# Patient Record
Sex: Male | Born: 1980 | Race: White | Hispanic: No | Marital: Single | Smoking: Current every day smoker
Health system: Southern US, Community
[De-identification: ages and names within clinical notes are randomized; demographics above are authoritative.]

---

## 2015-07-30 DIAGNOSIS — Y929 Unspecified place or not applicable: Secondary | ICD-10-CM | POA: Insufficient documentation

## 2015-07-30 DIAGNOSIS — Z79899 Other long term (current) drug therapy: Secondary | ICD-10-CM | POA: Insufficient documentation

## 2015-07-30 DIAGNOSIS — M545 Low back pain: Secondary | ICD-10-CM | POA: Insufficient documentation

## 2015-07-30 DIAGNOSIS — W01198A Fall on same level from slipping, tripping and stumbling with subsequent striking against other object, initial encounter: Secondary | ICD-10-CM | POA: Insufficient documentation

## 2015-07-30 DIAGNOSIS — Y939 Activity, unspecified: Secondary | ICD-10-CM | POA: Insufficient documentation

## 2015-07-30 DIAGNOSIS — Y999 Unspecified external cause status: Secondary | ICD-10-CM | POA: Insufficient documentation

## 2015-07-30 DIAGNOSIS — F17200 Nicotine dependence, unspecified, uncomplicated: Secondary | ICD-10-CM | POA: Insufficient documentation

## 2015-07-31 ENCOUNTER — Encounter (HOSPITAL_COMMUNITY): Payer: Self-pay | Admitting: *Deleted

## 2015-07-31 ENCOUNTER — Emergency Department (HOSPITAL_COMMUNITY)
Admission: EM | Admit: 2015-07-31 | Discharge: 2015-07-31 | Disposition: A | Discharge status: Home / Self Care | Payer: Self-pay | Attending: Emergency Medicine | Admitting: Emergency Medicine

## 2015-07-31 ENCOUNTER — Emergency Department (HOSPITAL_COMMUNITY): Payer: Self-pay

## 2015-07-31 DIAGNOSIS — M545 Low back pain, unspecified: Secondary | ICD-10-CM

## 2015-07-31 DIAGNOSIS — W19XXXA Unspecified fall, initial encounter: Secondary | ICD-10-CM

## 2015-07-31 MED ORDER — LIDOCAINE 5 % EX PTCH: 1.0000 | MEDICATED_PATCH | CUTANEOUS | Status: AC

## 2015-07-31 MED ORDER — NAPROXEN 250 MG PO TABS
500.0000 mg | ORAL_TABLET | Freq: Once | ORAL | Status: AC
  Administered 2015-07-31: 500 mg via ORAL
  Filled 2015-07-31: qty 2

## 2015-07-31 MED ORDER — NAPROXEN 500 MG PO TABS: 500.0000 mg | ORAL_TABLET | Freq: Two times a day (BID) | ORAL | Status: AC

## 2015-07-31 MED ORDER — OXYCODONE-ACETAMINOPHEN 5-325 MG PO TABS: 1.0000 | ORAL_TABLET | Freq: Four times a day (QID) | ORAL | Status: AC | PRN

## 2015-07-31 MED ORDER — METHOCARBAMOL 500 MG PO TABS: 500.0000 mg | ORAL_TABLET | Freq: Two times a day (BID) | ORAL | Status: AC

## 2015-07-31 MED ORDER — OXYCODONE-ACETAMINOPHEN 5-325 MG PO TABS
1.0000 | ORAL_TABLET | Freq: Once | ORAL | Status: AC
  Administered 2015-07-31: 1 via ORAL
  Filled 2015-07-31: qty 1

## 2015-07-31 NOTE — ED Provider Notes (Signed)
CSN: [id number]     Arrival date & time 07/30/15  2358 History   First MD Initiated Contact with Patient 07/31/15 0012     Chief Complaint  Patient presents with  . Back Pain     (Consider location/radiation/quality/duration/timing/severity/associated sxs/prior Treatment) HPI   Carlos Mcdaniel is a 35 y.o. male, patient with no pertinent past medical history, presenting to the ED with lower back pain following a fall just prior to arrival. Patient states he slipped on his wet porch steps and fell backwards hitting his lower back on the porch. Patient describes his pain is moderate, throbbing, nonradiating. Located bilateral lower back. Patient denies head trauma, LOC, neuro deficits, vomiting, or any other complaints.    History reviewed. No pertinent past medical history. History reviewed. No pertinent past surgical history. History reviewed. No pertinent family history. Social History  Substance Use Topics  . Smoking status: Current Every Day Smoker  . Smokeless tobacco: Current User  . Alcohol Use: No    Review of Systems  Gastrointestinal: Negative for vomiting.  Musculoskeletal: Positive for back pain. Negative for neck pain.  Skin: Negative for pallor and wound.  Neurological: Negative for dizziness, syncope, weakness, light-headedness, numbness and headaches.      Allergies  Toradol  Home Medications   Prior to Admission medications   Medication Sig Start Date End Date Taking? Authorizing Provider  acetaminophen (TYLENOL) 500 MG tablet Take 1,000 mg by mouth every 6 (six) hours as needed for mild pain.   Yes Historical Provider, MD  lidocaine (LIDODERM) 5 % Place 1 patch onto the skin daily. Remove & Discard patch within 12 hours or as directed by MD 07/31/15   Anselm PancoastShawn C Joy, PA-C  methocarbamol (ROBAXIN) 500 MG tablet Take 1 tablet (500 mg total) by mouth 2 (two) times daily. 07/31/15   Shawn C Joy, PA-C  naproxen (NAPROSYN) 500 MG tablet Take 1 tablet (500 mg total)  by mouth 2 (two) times daily. 07/31/15   Shawn C Joy, PA-C  oxyCODONE-acetaminophen (PERCOCET/ROXICET) 5-325 MG tablet Take 1 tablet by mouth every 6 (six) hours as needed for severe pain. 07/31/15   Shawn C Joy, PA-C   BP 117/82 mmHg  Pulse 68  Temp(Src) 97.9 F (36.6 C) (Oral)  Resp 20  Ht 5\' 8"  (1.727 m)  Wt 70.308 kg  BMI 23.57 kg/m2  SpO2 100% Physical Exam  Constitutional: He is oriented to person, place, and time. He appears well-developed and well-nourished. No distress.  HENT:  Head: Normocephalic and atraumatic.  Eyes: Conjunctivae are normal.  Neck: Neck supple.  Cardiovascular: Normal rate and regular rhythm.   Pulmonary/Chest: Effort normal. No respiratory distress.  Abdominal: There is no guarding.  Musculoskeletal: Normal range of motion. He exhibits tenderness. He exhibits no edema.  Tenderness to the midline lumbar spine as well as the musculature bilaterally. Full ROM in all extremities and spine. No other paraspinal tenderness.   Neurological: He is alert and oriented to person, place, and time. He has normal reflexes.  No sensory deficits. Strength 5/5 in all extremities. No gait disturbance. Coordination intact.   Skin: Skin is warm and dry. He is not diaphoretic.  Psychiatric: He has a normal mood and affect. His behavior is normal.  Nursing note and vitals reviewed.   ED Course  Procedures (including critical care time)  Imaging Review Dg Lumbar Spine Complete  07/31/2015  CLINICAL DATA:  Status post fall, with lower back pain. Initial encounter. EXAM: LUMBAR SPINE - COMPLETE 4+ VIEW  COMPARISON:  None. FINDINGS: There is no evidence of fracture or subluxation. Vertebral bodies demonstrate normal height and alignment. Intervertebral disc spaces are preserved. The visualized neural foramina are grossly unremarkable in appearance. The visualized bowel gas pattern is unremarkable in appearance; air and stool are noted within the colon. The sacroiliac joints are  within normal limits. IMPRESSION: No evidence of fracture or subluxation along the lumbar spine. Electronically Signed   By: Roanna RaiderJeffery  Chang M.D.   On: 07/31/2015 01:32   I have personally reviewed and evaluated these images as part of my medical decision-making.   EKG Interpretation None      MDM   Final diagnoses:  Fall, initial encounter  Bilateral low back pain without sciatica    [name] presents with lower back pain following a slip and fall just prior to arrival.  Patient has no neuro or functional deficits. Pain controlled with conservative management. No fracture or other acute abnormality on x-ray. Home care and return precautions discussed. Patient to follow up with PCP should symptoms fail to resolve. Patient voiced understanding of these instructions and is comfortable with discharge. Naproxen was given here in the ED as a trial due to the patient's Toradol allergy. Patient had no adverse reaction to the naproxen.  Filed Vitals:   07/31/15 0006  BP: 117/82  Pulse: 68  Temp: 97.9 F (36.6 C)  TempSrc: Oral  Resp: 20  Height: 5\' 8"  (1.727 m)  Weight: 70.308 kg  SpO2: 100%       Anselm PancoastShawn C Joy, PA-C 08/01/15 0258  Dione Boozeavid Glick, MD 08/01/15 [phone]

## 2015-07-31 NOTE — ED Notes (Signed)
Patient is alert and orientedx4.  Patient was explained discharge instructions and they understood them with no questions.  The patient's friend is taking the patient home.

## 2015-07-31 NOTE — Discharge Instructions (Signed)
You have been seen today for back pain from a fall. Your imaging showed no abnormalities. Expect your soreness to increase over the next 2-3 days. Take it easy, but do not lay around too much as this may make the stiffness worse. Take 500 mg of naproxen every 12 hours or 800 mg of ibuprofen every 8 hours for the next 3 days. Take these medications with food to avoid upset stomach. Robaxin is a muscle relaxer and may help loosen stiff muscles. Percocet for severe pain. Do not take the Robaxin or Percocet while driving or performing other dangerous activities. Follow up with PCP as needed should symptoms fail to resolve. Return to ED should symptoms worsen.

## 2015-07-31 NOTE — ED Notes (Signed)
Pt c/o lower back pain from slipping on a porch and landing on his back. Fall happened around 2200-2100 tonight. Pt denies hitting head, LOC.

## 2017-03-06 IMAGING — DX DG LUMBAR SPINE COMPLETE 4+V
5 series · 5 of 5 positions shown · non-contrast
Comparison: None.

CLINICAL DATA: Status post fall, with lower back pain. Initial
encounter.

EXAM:
LUMBAR SPINE - COMPLETE 4+ VIEW

[l-spine ap]
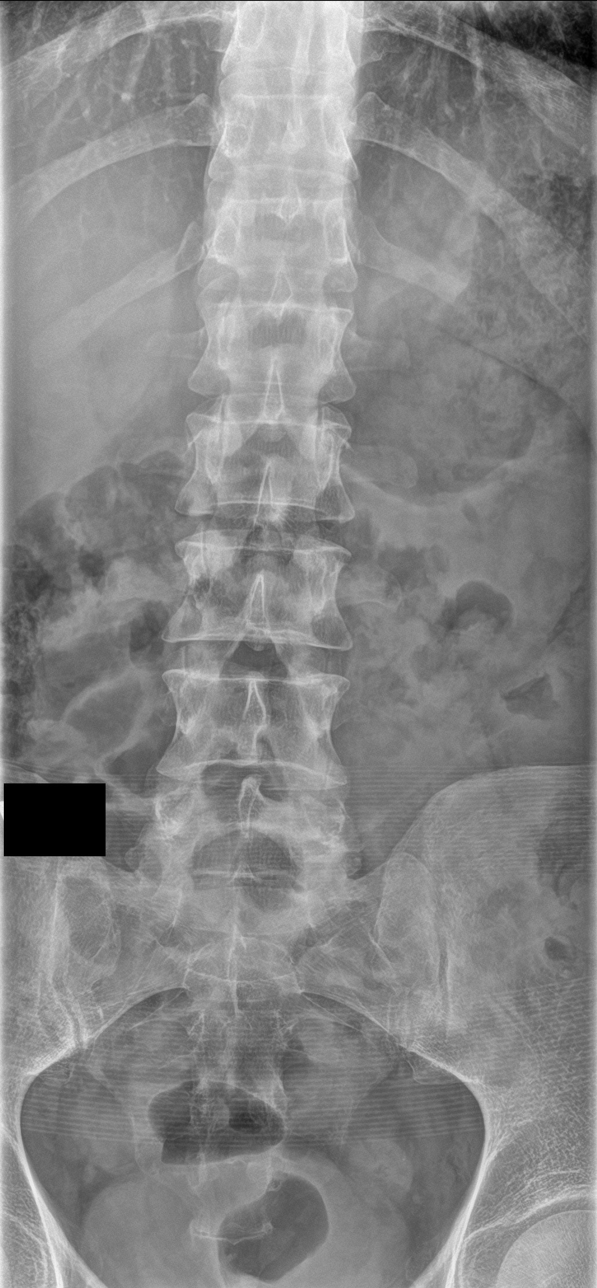

[l-spine obl (1 of 2)]
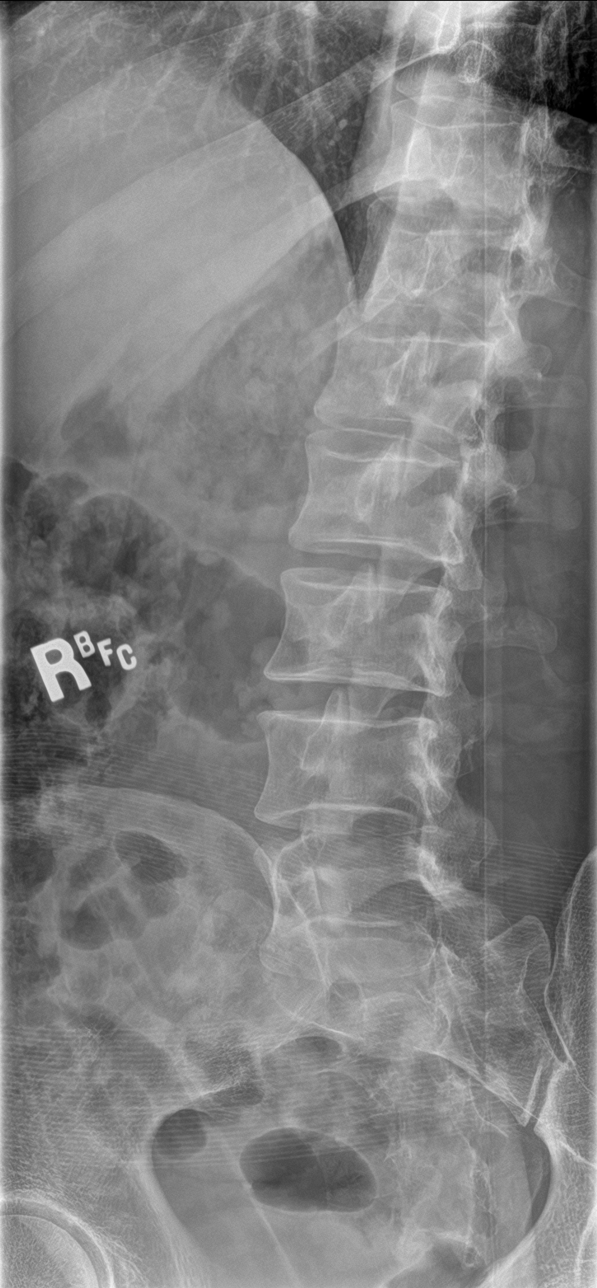

[l-spine obl (2 of 2)]
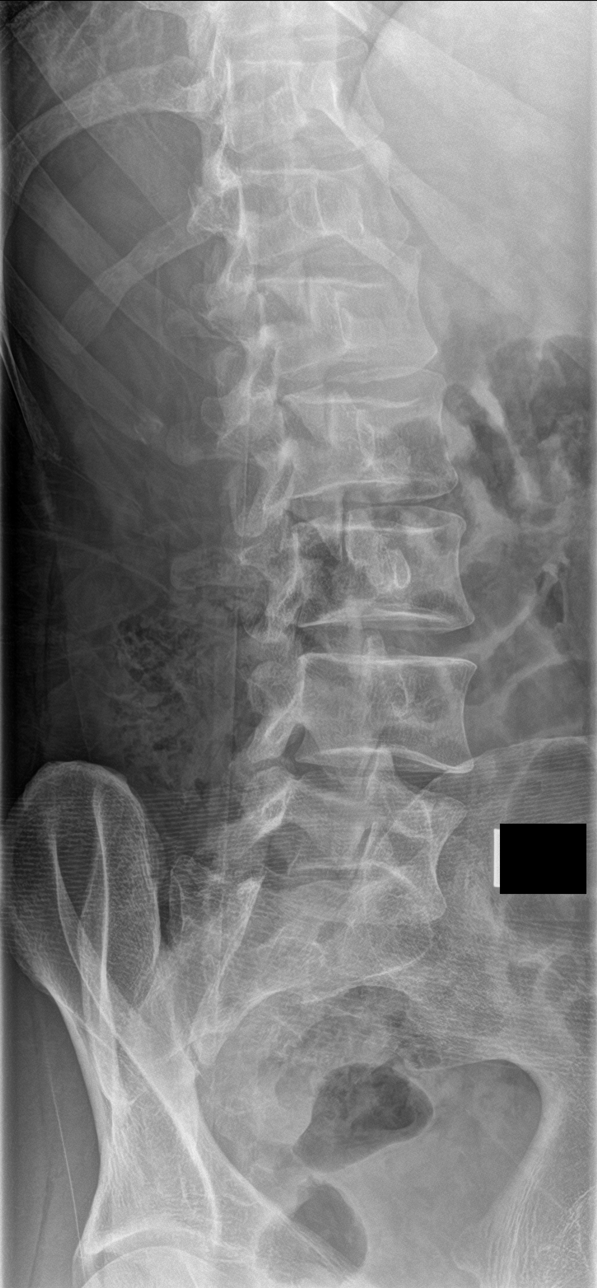

[l-spine lat]
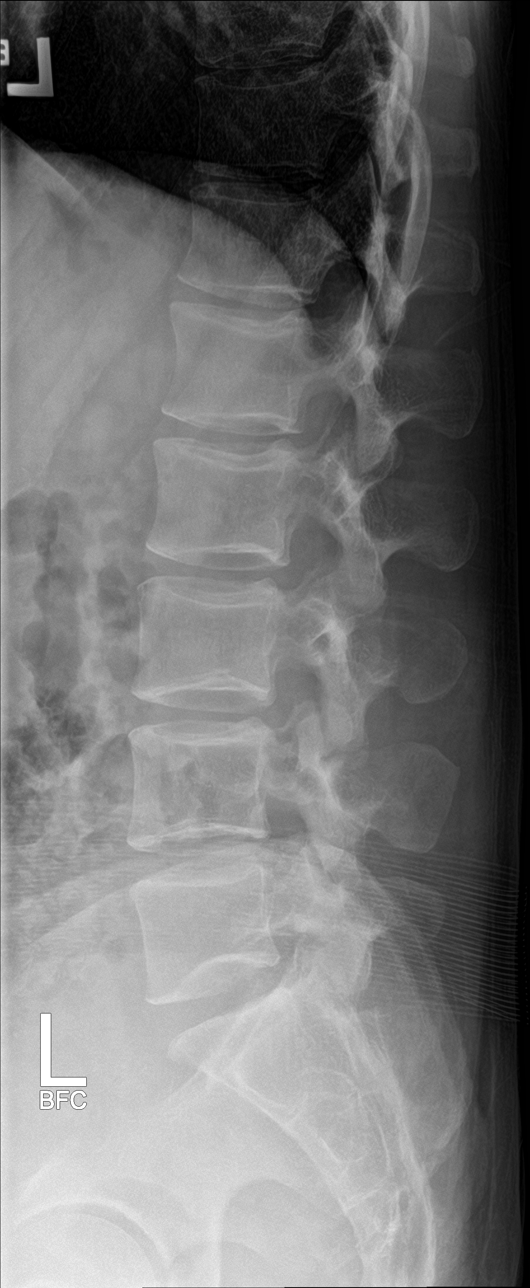

[l-spine spot]
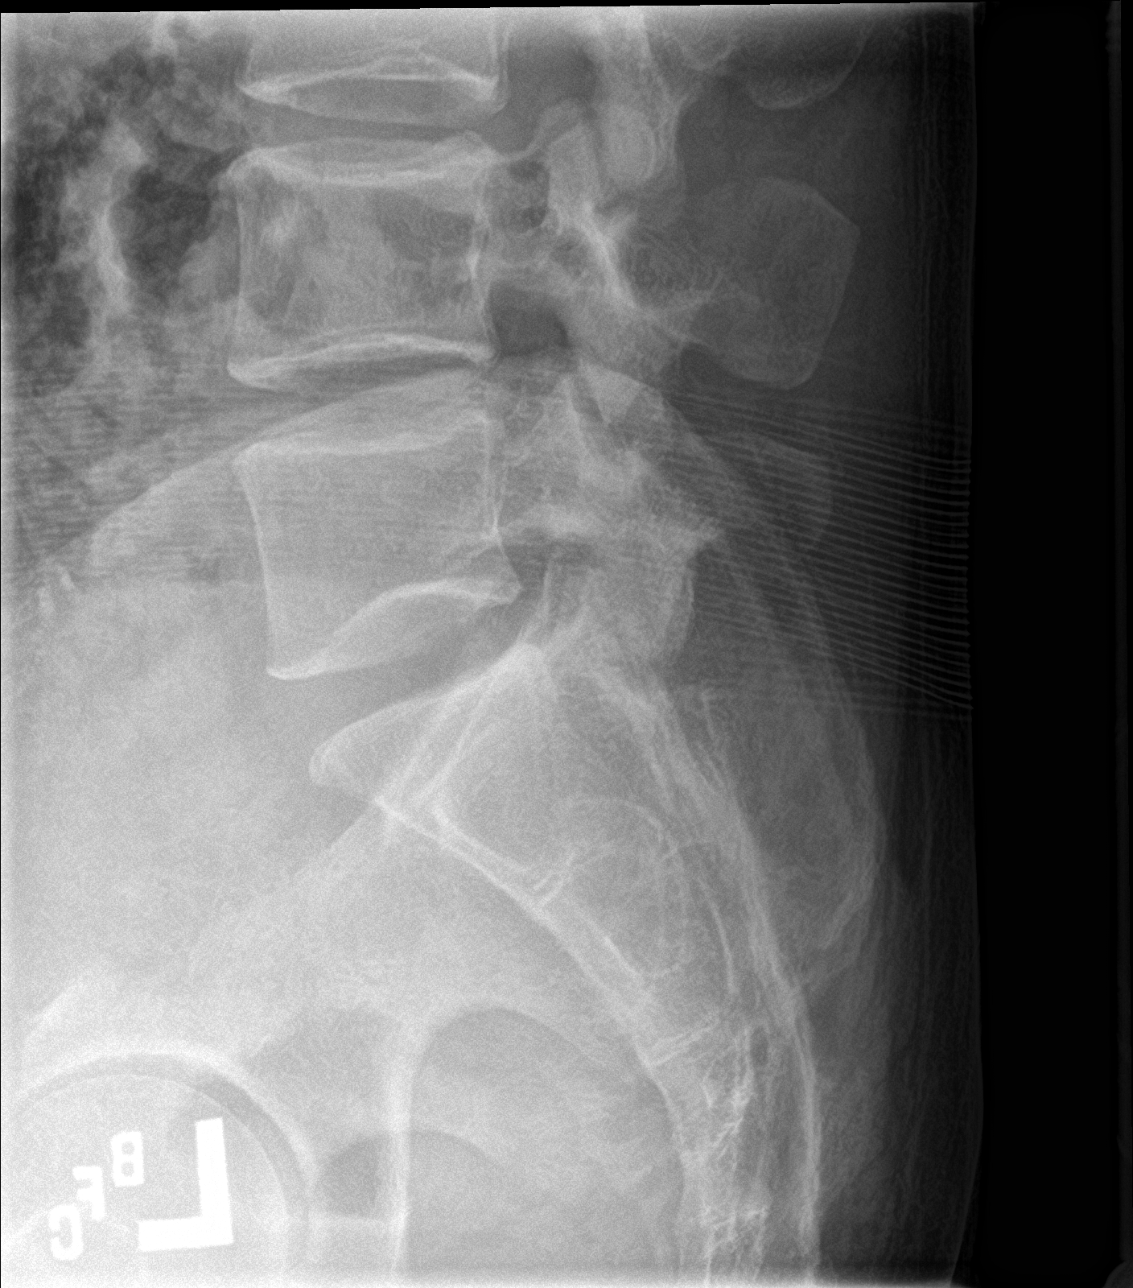

[5 of 5 positions shown; findings below may reference images not displayed]

FINDINGS: There is no evidence of fracture or subluxation. Vertebral bodies
demonstrate normal height and alignment. Intervertebral disc spaces
are preserved. The visualized neural foramina are grossly
unremarkable in appearance.

The visualized bowel gas pattern is unremarkable in appearance; air
and stool are noted within the colon. The sacroiliac joints are
within normal limits.
IMPRESSION: No evidence of fracture or subluxation along the lumbar spine.
